# Patient Record
Sex: Male | Born: 1985 | Race: Black or African American | Hispanic: No | Marital: Single | Smoking: Current some day smoker
Health system: Southern US, Community
[De-identification: ages and names within clinical notes are randomized; demographics above are authoritative.]

## PROBLEM LIST (undated history)

## (undated) DIAGNOSIS — I10 Essential (primary) hypertension: Secondary | ICD-10-CM

## (undated) HISTORY — DX: Essential (primary) hypertension: I10

---

## 2006-09-09 ENCOUNTER — Emergency Department (HOSPITAL_COMMUNITY): Admission: EM | Admit: 2006-09-09 | Discharge: 2006-09-09 | Payer: Self-pay | Admitting: Emergency Medicine

## 2006-09-22 ENCOUNTER — Emergency Department (HOSPITAL_COMMUNITY): Admission: EM | Admit: 2006-09-22 | Discharge: 2006-09-22 | Payer: Self-pay | Admitting: Emergency Medicine

## 2006-12-16 ENCOUNTER — Emergency Department (HOSPITAL_COMMUNITY): Admission: EM | Admit: 2006-12-16 | Discharge: 2006-12-16 | Payer: Self-pay | Admitting: Emergency Medicine

## 2013-03-09 ENCOUNTER — Emergency Department (HOSPITAL_COMMUNITY)
Admission: EM | Admit: 2013-03-09 | Discharge: 2013-03-09 | Disposition: A | Discharge status: Home / Self Care | Payer: Self-pay | Attending: Emergency Medicine | Admitting: Emergency Medicine

## 2013-03-09 ENCOUNTER — Emergency Department (HOSPITAL_COMMUNITY): Payer: Self-pay

## 2013-03-09 ENCOUNTER — Encounter (HOSPITAL_COMMUNITY): Payer: Self-pay | Admitting: Emergency Medicine

## 2013-03-09 DIAGNOSIS — M25569 Pain in unspecified knee: Secondary | ICD-10-CM | POA: Insufficient documentation

## 2013-03-09 DIAGNOSIS — M25562 Pain in left knee: Secondary | ICD-10-CM

## 2013-03-09 MED ORDER — IBUPROFEN 600 MG PO TABS: 600.0000 mg | ORAL_TABLET | Freq: Four times a day (QID) | ORAL | Status: AC | PRN

## 2013-03-09 NOTE — ED Notes (Signed)
Pt c/o lt knee pain for months, no injury just wanted to have it checked out

## 2013-03-09 NOTE — ED Provider Notes (Signed)
CSN: [id number]     Arrival date & time 03/09/13  1608 History   First MD Initiated Contact with Patient 03/09/13 1653     This chart was scribed for Henry Goodman, by Ladona Ridgel Day, ED scribe. This patient was seen in room WTR8/WTR8 and the patient's care was started at 1653.  Chief Complaint  Patient presents with  . Knee Pain   The history is provided by the patient. No language interpreter was used.   HPI Comments: Henry Goodman is a 27 y.o. male who presents to the Emergency Department complaining of constant, gradually worsened left knee pain (10/10 max in pain and currently 2/10), onset 2 months ago, no recent injuries. He states pain w/ambulation or extending his knee. He has been taking aleve w/out any improvement in his pain. Denies previous knee surgeries.   History reviewed. No pertinent past medical history. History reviewed. No pertinent past surgical history. No family history on file. History  Substance Use Topics  . Smoking status: Never Smoker   . Smokeless tobacco: Not on file  . Alcohol Use: Yes     Comment: social    Review of Systems  Musculoskeletal:       Left knee pain.  All other systems reviewed and are negative.   A complete 10 system review of systems was obtained and all systems are negative except as noted in the HPI and PMH.   Allergies  Review of patient's allergies indicates no known allergies.  Home Medications   Current Outpatient Rx  Name  Route  Sig  Dispense  Refill  . Multiple Vitamin (MULTIVITAMIN WITH MINERALS) TABS tablet   Oral   Take 1 tablet by mouth daily.         Marland Kitchen ibuprofen (ADVIL,MOTRIN) 600 MG tablet   Oral   Take 1 tablet (600 mg total) by mouth every 6 (six) hours as needed.   30 tablet   0    Triage Vitals: BP 147/82  Pulse 62  Temp(Src) 98.6 F (37 C) (Oral)  Resp 20  SpO2 97% Physical Exam  Nursing note and vitals reviewed. Constitutional: He is oriented to person, place, and time. He appears  well-developed and well-nourished.  HENT:  Head: Normocephalic and atraumatic.  Eyes: EOM are normal.  Neck: Normal range of motion.  Cardiovascular: Normal rate.   Pulmonary/Chest: Effort normal.  Musculoskeletal: Normal range of motion. He exhibits tenderness. He exhibits no edema.  Tender over anterior proximal tibial tuberosity. Full ROM no medial or lateral joint line tenderness. No edema, ecchymosis, erythema or warmth  Neurological: He is alert and oriented to person, place, and time.  Skin: Skin is warm and dry.  Psychiatric: He has a normal mood and affect. His behavior is normal.    ED Course  Procedures (including critical care time) DIAGNOSTIC STUDIES: Oxygen Saturation is 97% on room air, normal by my interpretation.    COORDINATION OF CARE: At 500 PM Discussed treatment plan with patient which includes left knee X-ray. Patient agrees.   Labs Review Labs Reviewed - No data to display Imaging Review Dg Knee Complete 4 Views Left  03/09/2013   CLINICAL DATA:  Left knee pain  EXAM: LEFT KNEE - COMPLETE 4+ VIEW  COMPARISON:  None.  FINDINGS: There is no evidence of fracture, dislocation, or joint effusion. There is no evidence of arthropathy or other focal bone abnormality. Soft tissues are unremarkable.  IMPRESSION: No acute abnormality noted.   Electronically Signed   By: Loraine Leriche  Lukens M.D.   On: 03/09/2013 17:22    EKG Interpretation   None       MDM   1. Left knee pain    Pt c/o 88mo hx of left knee pain w/o known injury. No evidence of cellulitis or trauma. Plain films: no acute abnormality. No evidence of joint effusion or arthropathy. Advised will tx with knee sleeve and antiinflammatories. Will have f/u with [facility] in 1-2 weeks if not improving. Pt verbalized understanding and agreement with tx plan.  I personally performed the services described in this documentation, which was scribed in my presence. The recorded information has been reviewed  and is accurate.     Junius Finner, PA-C 03/09/13 1759

## 2013-03-10 NOTE — ED Provider Notes (Signed)
  Medical screening examination/treatment/procedure(s) were performed by non-physician practitioner and as supervising physician I was immediately available for consultation/collaboration.  EKG Interpretation   None          Janeal Abadi, MD 03/10/13 0015 

## 2014-06-18 IMAGING — CR DG KNEE COMPLETE 4+V*L*
5 series · 5 of 5 positions shown · non-contrast
Comparison: None.

CLINICAL DATA: Left knee pain

EXAM:
LEFT KNEE - COMPLETE 4+ VIEW

[t knee ap left]
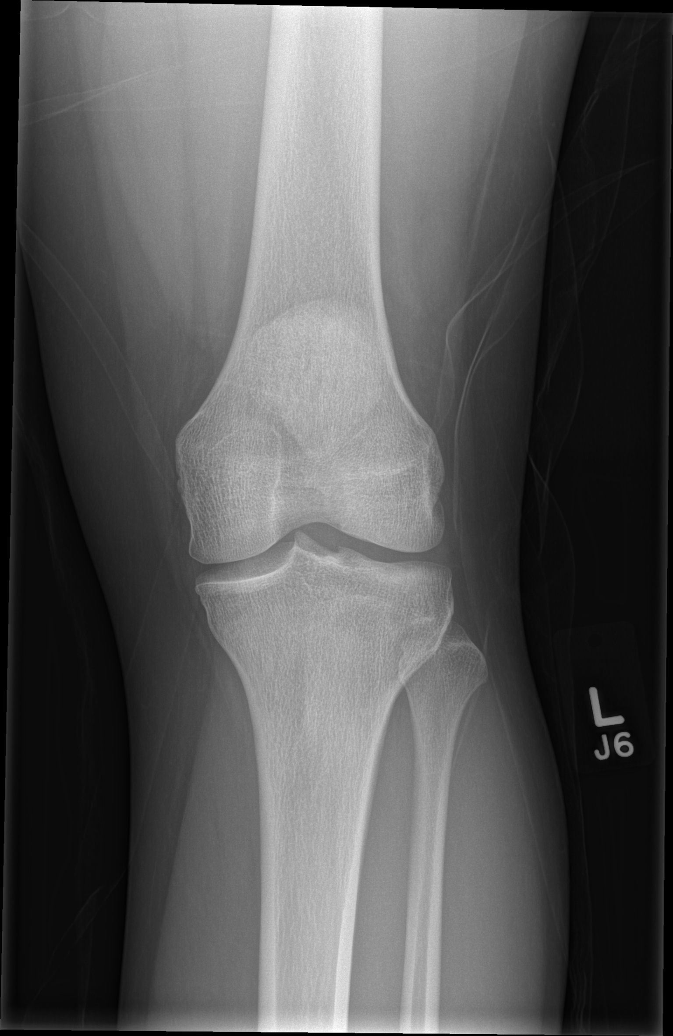

[t knee obl left (1 of 2)]
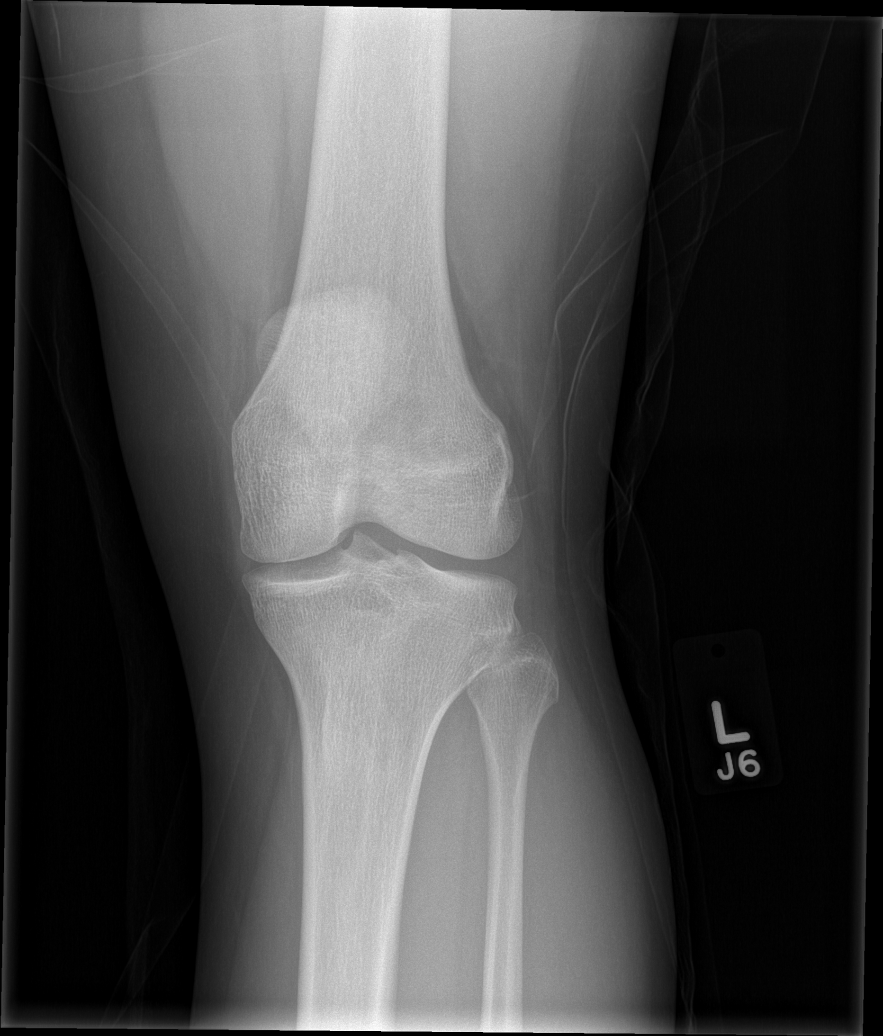

[t knee obl left (2 of 2)]
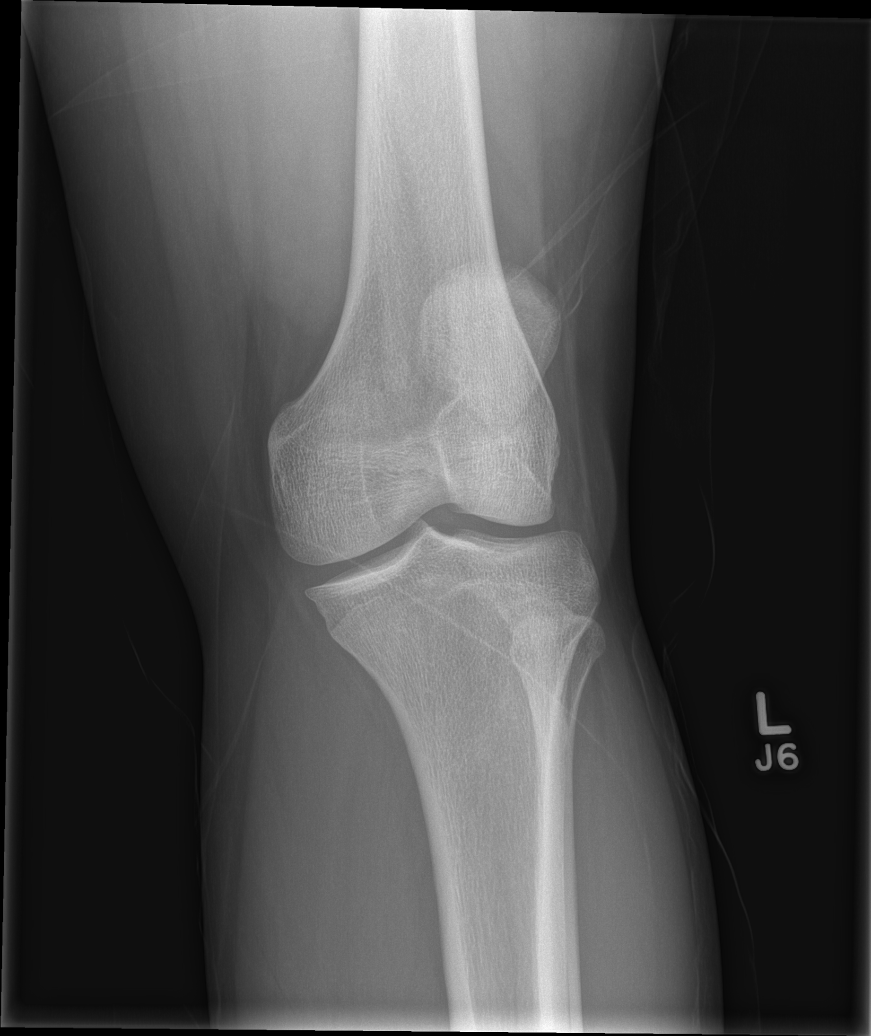

[t knee lat left (1 of 2)]
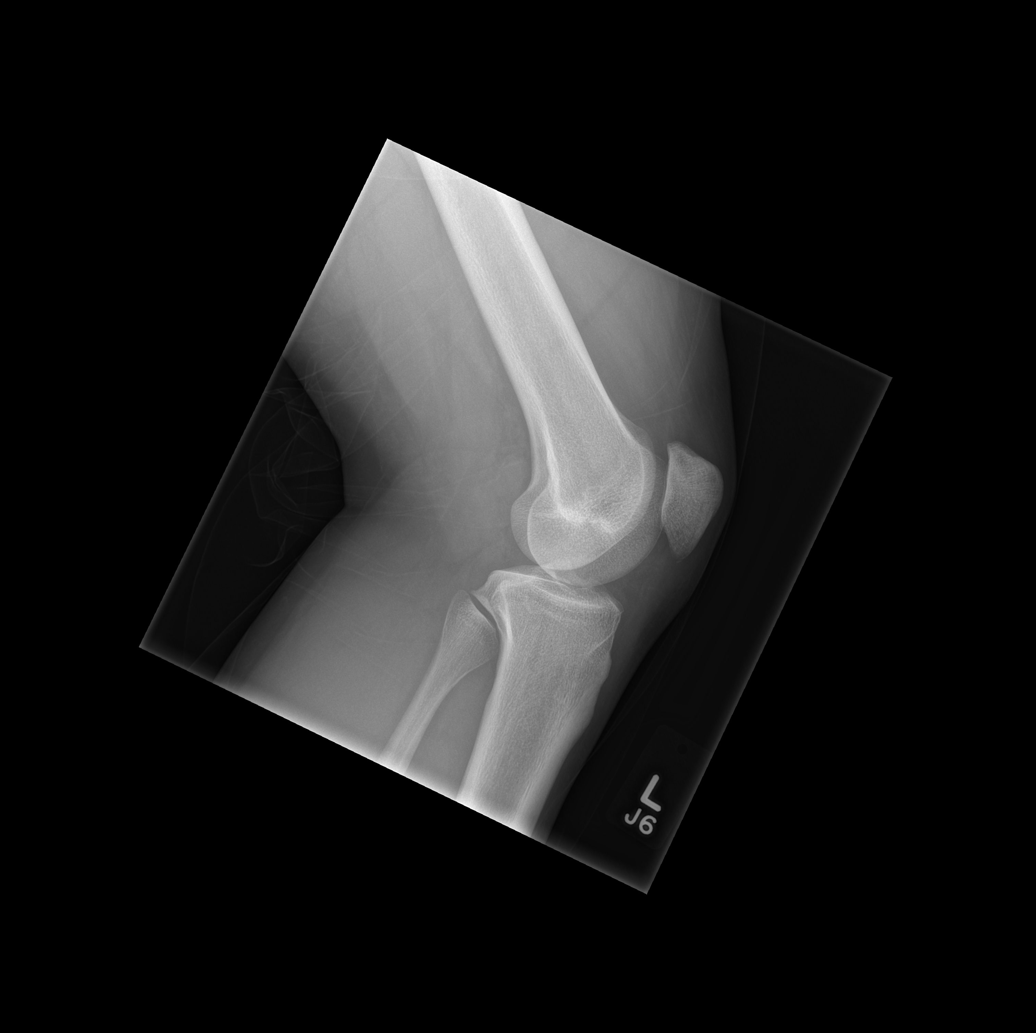

[t knee lat left (2 of 2)]
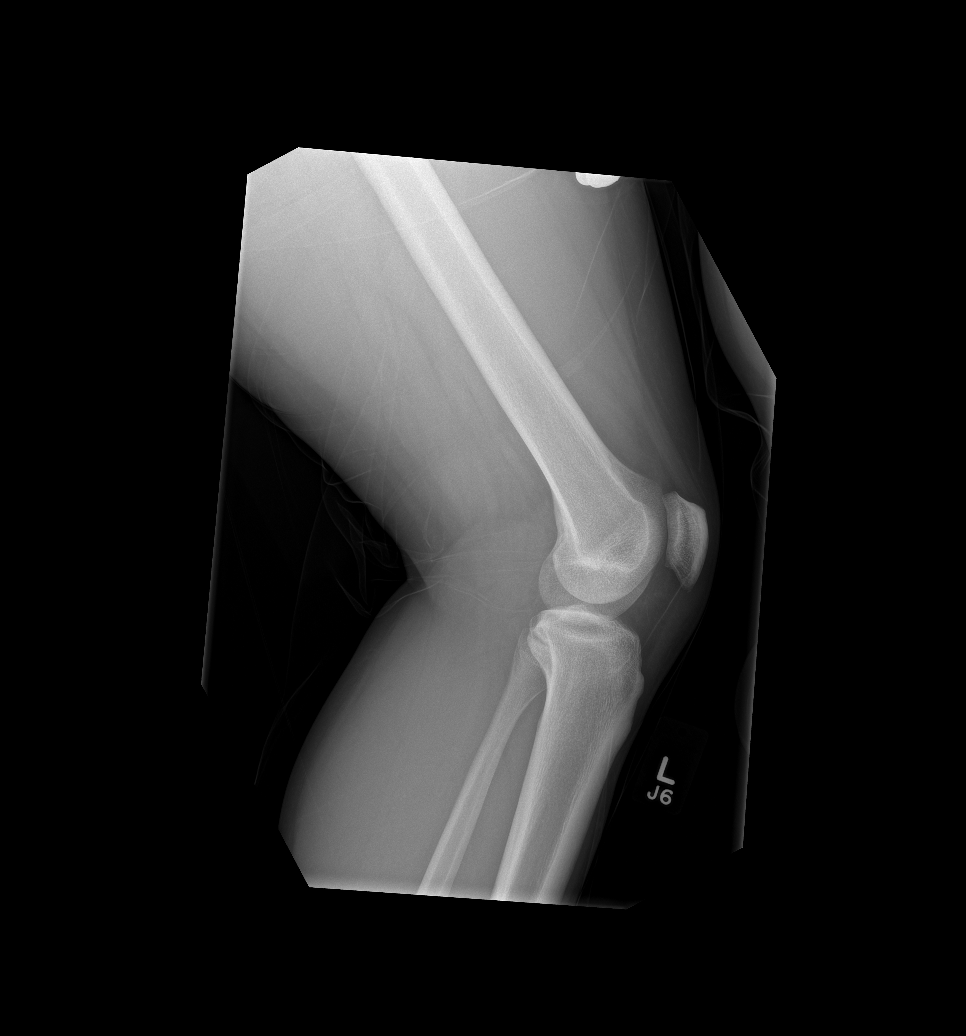

[5 of 5 positions shown; findings below may reference images not displayed]

FINDINGS: There is no evidence of fracture, dislocation, or joint effusion.
There is no evidence of arthropathy or other focal bone abnormality.
Soft tissues are unremarkable.
IMPRESSION: No acute abnormality noted.

## 2021-11-16 ENCOUNTER — Other Ambulatory Visit: Payer: Self-pay

## 2021-11-16 ENCOUNTER — Emergency Department (HOSPITAL_COMMUNITY)
Admission: EM | Admit: 2021-11-16 | Discharge: 2021-11-16 | Disposition: A | Discharge status: Home / Self Care | Payer: Self-pay | Attending: Emergency Medicine | Admitting: Emergency Medicine

## 2021-11-16 DIAGNOSIS — I10 Essential (primary) hypertension: Secondary | ICD-10-CM | POA: Insufficient documentation

## 2021-11-16 DIAGNOSIS — R112 Nausea with vomiting, unspecified: Secondary | ICD-10-CM | POA: Insufficient documentation

## 2021-11-16 LAB — COMPREHENSIVE METABOLIC PANEL
ALT: 40 U/L (ref 0–44)
AST: 28 U/L (ref 15–41)
Albumin: 3.8 g/dL (ref 3.5–5.0)
Alkaline Phosphatase: 79 U/L (ref 38–126)
Anion gap: 13 (ref 5–15)
BUN: 14 mg/dL (ref 6–20)
CO2: 22 mmol/L (ref 22–32)
Calcium: 8.8 mg/dL — ABNORMAL LOW (ref 8.9–10.3)
Chloride: 104 mmol/L (ref 98–111)
Creatinine, Ser: 0.93 mg/dL (ref 0.61–1.24)
GFR, Estimated: >60 mL/min (ref >60)
Glucose, Bld: 110 mg/dL — ABNORMAL HIGH (ref 70–99)
Potassium: 3.7 mmol/L (ref 3.5–5.1)
Sodium: 139 mmol/L (ref 135–145)
Total Bilirubin: 0.9 mg/dL (ref 0.3–1.2)
Total Protein: 6.8 g/dL (ref 6.5–8.1)

## 2021-11-16 LAB — CBC
HCT: 45.1 % (ref 39.0–52.0)
Hemoglobin: 15.1 g/dL (ref 13.0–17.0)
MCH: 28.9 pg (ref 26.0–34.0)
MCHC: 33.5 g/dL (ref 30.0–36.0)
MCV: 86.4 fL (ref 80.0–100.0)
Platelets: 359 10*3/uL (ref 150–400)
RBC: 5.22 MIL/uL (ref 4.22–5.81)
RDW: 14 % (ref 11.5–15.5)
WBC: 8.7 10*3/uL (ref 4.0–10.5)
nRBC: 0 % (ref 0.0–0.2)

## 2021-11-16 LAB — URINALYSIS, ROUTINE W REFLEX MICROSCOPIC
Bilirubin Urine: NEGATIVE
Glucose, UA: NEGATIVE mg/dL
Hgb urine dipstick: NEGATIVE
Ketones, ur: NEGATIVE mg/dL
Nitrite: NEGATIVE
Protein, ur: NEGATIVE mg/dL
Specific Gravity, Urine: 1.024 (ref 1.005–1.030)
WBC, UA: >50 WBC/hpf — ABNORMAL HIGH (ref 0–5)
pH: 5 (ref 5.0–8.0)

## 2021-11-16 LAB — LIPASE, BLOOD: Lipase: 27 U/L (ref 11–51)

## 2021-11-16 MED ORDER — ONDANSETRON 4 MG PO TBDP: 4.0000 mg | ORAL_TABLET | Freq: Once | ORAL | Status: DC

## 2021-11-16 NOTE — ED Provider Notes (Signed)
[facility] EMERGENCY DEPARTMENT Provider Note   CSN: [id number] Arrival date & time: 11/16/21  1138     History  Chief Complaint  Patient presents with   Emesis    Henry Goodman is a 36 y.o. male.  36 year old male presents with complaint of nausea and vomiting with frequent stools last night.  Symptoms have since resolved, he was concerned that he may have gotten food poisoning however states he did the cooking last night and doubt it was anything it was in his food.  He denies any fevers or chills, changes in bladder habits.  Blood pressure noted to be elevated today.  Patient does aware and has been trying to control diet and exercise.       Home Medications Prior to Admission medications   Medication Sig Start Date End Date Taking? Authorizing Provider  ibuprofen (ADVIL,MOTRIN) 600 MG tablet Take 1 tablet (600 mg total) by mouth every 6 (six) hours as needed. 03/09/13   Lurene Shadow, PA-C  Multiple Vitamin (MULTIVITAMIN WITH MINERALS) TABS tablet Take 1 tablet by mouth daily.    [provider]      Allergies    Patient has no known allergies.    Review of Systems   Review of Systems Negative except as per HPI Physical Exam Updated Vital Signs BP (!) 162/116   Pulse (!) 106   Temp 98.6 F (37 C) (Oral)   Resp 18   SpO2 97%  Physical Exam Vitals and nursing note reviewed.  Constitutional:      General: He is not in acute distress.    Appearance: He is well-developed. He is not diaphoretic.  HENT:     Head: Normocephalic and atraumatic.  Cardiovascular:     Rate and Rhythm: Normal rate and regular rhythm.     Heart sounds: Normal heart sounds.  Pulmonary:     Effort: Pulmonary effort is normal.     Breath sounds: Normal breath sounds.  Abdominal:     Palpations: Abdomen is soft.     Tenderness: There is no abdominal tenderness. There is no right CVA tenderness or left CVA tenderness.  Musculoskeletal:     Right lower leg:  No edema.     Left lower leg: No edema.  Skin:    General: Skin is warm and dry.     Findings: No erythema or rash.  Neurological:     Mental Status: He is alert and oriented to person, place, and time.  Psychiatric:        Behavior: Behavior normal.     ED Results / Procedures / Treatments   Labs (all labs ordered are listed, but only abnormal results are displayed) Labs Reviewed  COMPREHENSIVE METABOLIC PANEL - Abnormal; Notable for the following components:      Result Value   Glucose, Bld 110 (*)    Calcium 8.8 (*)    All other components within normal limits  URINALYSIS, ROUTINE W REFLEX MICROSCOPIC - Abnormal; Notable for the following components:   APPearance HAZY (*)    Leukocytes,Ua MODERATE (*)    WBC, UA >50 (*)    Bacteria, UA RARE (*)    All other components within normal limits  LIPASE, BLOOD  CBC  GC/CHLAMYDIA PROBE AMP ([place]) NOT AT [facility]    EKG None  Radiology No results found.  Procedures Procedures    Medications Ordered in ED Medications  ondansetron (ZOFRAN-ODT) disintegrating tablet 4 mg (has no administration in time  range)    ED Course/ Medical Decision Making/ A&P                           Medical Decision Making  This patient presents to the ED for concern of vomiting, this involves an extensive number of treatment options, and is a complaint that carries with it a high risk of complications and morbidity.  The differential diagnosis includes but not limited to gastritis, gastroenteritis   Co morbidities that complicate the patient evaluation  Unmanaged hypertension   Additional history obtained:  External records from outside source obtained and reviewed including no recent relevant records   Lab Tests:  I Ordered, and personally interpreted labs.  The pertinent results include: CBC is unremarkable, CMP within normal limits.  Lipase is normal.  Urinalysis is hazy with moderate leukocytes with greater than 50 white  cells and rare bacteria.  No urinary symptoms today.  Add on gonorrhea chlamydia sent out.   Problem List / ED Course / Critical interventions / Medication management  36 year old male with complaint of vomiting and frequent stools last night.  Presents today feeling improved with no further vomiting, is tolerating small sips of water.  Abdomen is soft and nontender.  Patient requests note as he has missed his jail time this weekend. I ordered medication including Zofran for nausea Reevaluation of the patient after these medicines showed that the patient resolved I have reviewed the patients home medicines and have made adjustments as needed   Social Determinants of Health:  No PCP   Test / Admission - Considered:  Consider abdominal imaging however symptoms have resolved.  Add on gonorrhea chlamydia for bacteria.         Final Clinical Impression(s) / ED Diagnoses Final diagnoses:  Nausea and vomiting, unspecified vomiting type  Hypertension, unspecified type    Rx / DC Orders ED Discharge Orders     None         Jeannie Fend, PA-C 11/16/21 1526    Cathren Laine, MD 11/16/21 [id number]

## 2021-11-16 NOTE — Discharge Instructions (Addendum)
Recheck with your primary care provider as needed. Monitor your blood pressure.  This should be managed by your primary care provider to prevent developing secondary complications from unmanaged high blood pressure.

## 2021-11-16 NOTE — ED Triage Notes (Signed)
Pt comes to the ED from home for eval of n/v since last night. No abdominal pain.  Pt has had frequent bowel movements but not watery diarrhea.  Pt feels this is similar to "food poisoning" he has had in the past. No fever, chills or shob.  Pt is hypertensive and states he is aware and has been trying to control with diet and exercise.

## 2021-12-14 ENCOUNTER — Other Ambulatory Visit: Payer: Self-pay

## 2021-12-14 ENCOUNTER — Emergency Department (HOSPITAL_BASED_OUTPATIENT_CLINIC_OR_DEPARTMENT_OTHER)
Admission: EM | Admit: 2021-12-14 | Discharge: 2021-12-14 | Disposition: A | Discharge status: Home / Self Care | Payer: Self-pay | Attending: Emergency Medicine | Admitting: Emergency Medicine

## 2021-12-14 ENCOUNTER — Encounter (HOSPITAL_BASED_OUTPATIENT_CLINIC_OR_DEPARTMENT_OTHER): Payer: Self-pay | Admitting: Emergency Medicine

## 2021-12-14 DIAGNOSIS — J069 Acute upper respiratory infection, unspecified: Secondary | ICD-10-CM | POA: Insufficient documentation

## 2021-12-14 MED ORDER — CETIRIZINE HCL 10 MG PO TABS: 10.0000 mg | ORAL_TABLET | Freq: Every day | ORAL | 0 refills | Status: AC

## 2021-12-14 MED ORDER — ONDANSETRON 4 MG PO TBDP: 4.0000 mg | ORAL_TABLET | Freq: Three times a day (TID) | ORAL | 0 refills | Status: AC | PRN

## 2021-12-14 NOTE — ED Notes (Signed)
Patient reports a dry cough. Denies any sob. Report nausea and vomiting . Vomit x4 in last 24 hours Some dizziness earlier denies dizziness now

## 2021-12-14 NOTE — Discharge Instructions (Signed)
Viral Illness TREATMENT  Treatment is directed at relieving symptoms. There is no cure. Antibiotics are not effective, because the infection is caused by a virus, not by bacteria. Treatment may include:  Increased fluid intake. Sports drinks offer valuable electrolytes, sugars, and fluids.  Breathing heated mist or steam (vaporizer or shower).  Eating chicken soup or other clear broths, and maintaining good nutrition.  Getting plenty of rest.  Using gargles or lozenges for comfort.  Increasing usage of your inhaler if you have asthma.  Return to work when your temperature has returned to normal.  Gargle warm salt water and spit it out for sore throat. Take benadryl to decrease sinus secretions. Continue to alternate between Tylenol and ibuprofen for pain and fever control.  Follow Up: Follow up with your primary care doctor in 5-7 days for recheck of ongoing symptoms.  Return to emergency department for emergent changing or worsening of symptoms.  

## 2021-12-14 NOTE — ED Provider Notes (Signed)
[facility] EMERGENCY DEPARTMENT Provider Note   CSN: [id number] Arrival date & time: 12/14/21  1244     History  Chief Complaint  Patient presents with   Cough   Emesis    Henry Goodman is a 36 y.o. male.   Cough Emesis Associated symptoms: cough    Patient is a 36 year old male presented today to the emergency room states that he has felt somewhat achy and tired and had a cough which is primarily dry but occasionally productive of small amount of clear sputum.  He states that he has been eating and drinking normally.  He states that he did have 2 episodes of nonbloody nonbilious emesis.  He has not taken any Tylenol or ibuprofen but did take some Tamiflu yesterday.  He is not particularly interested in being tested for COVID-19 or influenza today.  He states that he came primarily for a work note.      Home Medications Prior to Admission medications   Medication Sig Start Date End Date Taking? Authorizing Provider  cetirizine (ZYRTEC ALLERGY) 10 MG tablet Take 1 tablet (10 mg total) by mouth daily. 12/14/21  Yes Kaela Beitz S, PA  ondansetron (ZOFRAN-ODT) 4 MG disintegrating tablet Take 1 tablet (4 mg total) by mouth every 8 (eight) hours as needed for nausea or vomiting. 12/14/21  Yes Kingsley Herandez S, PA  ibuprofen (ADVIL,MOTRIN) 600 MG tablet Take 1 tablet (600 mg total) by mouth every 6 (six) hours as needed. 03/09/13   Lurene Shadow, PA-C  Multiple Vitamin (MULTIVITAMIN WITH MINERALS) TABS tablet Take 1 tablet by mouth daily.    [provider]      Allergies    Patient has no known allergies.    Review of Systems   Review of Systems  Respiratory:  Positive for cough.   Gastrointestinal:  Positive for vomiting.    Physical Exam Updated Vital Signs BP (!) 157/105   Pulse 80   Temp 98.3 F (36.8 C) (Oral)   Resp 16   Ht 5\' 8"  (1.727 m)   Wt 111.1 kg   SpO2 100%   BMI 37.25 kg/m  Physical Exam Vitals and nursing note reviewed.   Constitutional:      General: He is not in acute distress.    Comments: Pleasant well-appearing 36 year old.  In no acute distress.  Sitting comfortably in bed.  Able answer questions appropriately follow commands. No increased work of breathing. Speaking in full sentences.   HENT:     Head: Normocephalic and atraumatic.     Nose: Nose normal.  Eyes:     General: No scleral icterus. Cardiovascular:     Rate and Rhythm: Normal rate and regular rhythm.     Pulses: Normal pulses.     Heart sounds: Normal heart sounds.  Pulmonary:     Effort: Pulmonary effort is normal. No respiratory distress.     Breath sounds: No wheezing.     Comments: Lungs clear, speaking full sentences Abdominal:     Palpations: Abdomen is soft.     Tenderness: There is no abdominal tenderness.  Musculoskeletal:     Cervical back: Normal range of motion.     Right lower leg: No edema.     Left lower leg: No edema.  Skin:    General: Skin is warm and dry.     Capillary Refill: Capillary refill takes less than 2 seconds.  Neurological:     Mental Status: He is alert. Mental status is  at baseline.  Psychiatric:        Mood and Affect: Mood normal.        Behavior: Behavior normal.     ED Results / Procedures / Treatments   Labs (all labs ordered are listed, but only abnormal results are displayed) Labs Reviewed - No data to display  EKG None  Radiology No results found.  Procedures Procedures    Medications Ordered in ED Medications - No data to display  ED Course/ Medical Decision Making/ A&P Clinical Course as of 12/14/21 1612  Sat Dec 14, 2021  1603 Pt without medical issues.  States he coughed today a few times felt achy and tired. States no tylenol or ibuprofen. Vomited x2 times earlier. No blood in vomit.   [WF]    Clinical Course User Index [WF] Gailen Shelter, [place]                           Medical Decision Making Risk OTC drugs. Prescription drug management.   Patient is  a 36 year old male presented today to the emergency room states that he has felt somewhat achy and tired and had a cough which is primarily dry but occasionally productive of small amount of clear sputum.  He states that he has been eating and drinking normally.  He states that he did have 2 episodes of nonbloody nonbilious emesis.  He has not taken any Tylenol or ibuprofen but did take some Tamiflu yesterday.  He is not particularly interested in being tested for COVID-19 or influenza today.  He states that he came primarily for a work note.  Physical exam unremarkable.  Patient has clear lung sounds.  Normal vital signs.  Overall well-appearing.  Discussed additional work-up but will hold off at this time after shared decision-making conversation patient states he primarily wants work note.  Satting 100% on room air.  Breathing well and unlabored.  No chest pain or difficulty breathing.  No abdominal pain and does not feel nauseous currently.  We will discharge home with Zyrtec and Zofran in case he experiences any additional nausea.  Work note printed for patient as well.  Return precautions discussed  Final Clinical Impression(s) / ED Diagnoses Final diagnoses:  Viral upper respiratory tract infection    Rx / DC Orders ED Discharge Orders          Ordered    cetirizine (ZYRTEC ALLERGY) 10 MG tablet  Daily        12/14/21 1610    ondansetron (ZOFRAN-ODT) 4 MG disintegrating tablet  Every 8 hours PRN        12/14/21 1610              Gailen Shelter, [place] 12/14/21 1615    Virgina Norfolk, DO 12/14/21 2230

## 2021-12-14 NOTE — ED Triage Notes (Signed)
Pt arrives pov, steady gait, c/o cough and emesis since last night. Denies fever, denies abdominal pain, denies CP. Reports need of dr note

## 2024-03-08 ENCOUNTER — Other Ambulatory Visit: Payer: Self-pay

## 2024-03-08 ENCOUNTER — Emergency Department (HOSPITAL_COMMUNITY)

## 2024-03-08 ENCOUNTER — Emergency Department (HOSPITAL_COMMUNITY)
Admission: EM | Admit: 2024-03-08 | Discharge: 2024-03-08 | Disposition: A | Discharge status: Home / Self Care | Attending: Emergency Medicine | Admitting: Emergency Medicine

## 2024-03-08 DIAGNOSIS — I159 Secondary hypertension, unspecified: Secondary | ICD-10-CM | POA: Insufficient documentation

## 2024-03-08 DIAGNOSIS — M5441 Lumbago with sciatica, right side: Secondary | ICD-10-CM | POA: Insufficient documentation

## 2024-03-08 LAB — URINALYSIS, ROUTINE W REFLEX MICROSCOPIC
Bilirubin Urine: NEGATIVE
Glucose, UA: NEGATIVE mg/dL
Hgb urine dipstick: NEGATIVE
Ketones, ur: NEGATIVE mg/dL
Leukocytes,Ua: NEGATIVE
Nitrite: NEGATIVE
Protein, ur: NEGATIVE mg/dL
Specific Gravity, Urine: 1.015 (ref 1.005–1.030)
pH: 5 (ref 5.0–8.0)

## 2024-03-08 LAB — CBG MONITORING, ED: Glucose-Capillary: 100 mg/dL — ABNORMAL HIGH (ref 70–99)

## 2024-03-08 MED ORDER — NAPROXEN 500 MG PO TABS: 500.0000 mg | ORAL_TABLET | Freq: Two times a day (BID) | ORAL | 0 refills | Status: AC

## 2024-03-08 MED ORDER — KETOROLAC TROMETHAMINE 15 MG/ML IJ SOLN
15.0000 mg | Freq: Once | INTRAMUSCULAR | Status: AC
  Administered 2024-03-08: 15 mg via INTRAMUSCULAR
  Filled 2024-03-08: qty 1

## 2024-03-08 MED ORDER — PREDNISONE 20 MG PO TABS: 40.0000 mg | ORAL_TABLET | Freq: Every day | ORAL | 0 refills | Status: AC

## 2024-03-08 MED ORDER — NAPROXEN 500 MG PO TABS: 500.0000 mg | ORAL_TABLET | Freq: Two times a day (BID) | ORAL | 0 refills | Status: DC

## 2024-03-08 MED ORDER — CYCLOBENZAPRINE HCL 10 MG PO TABS: 10.0000 mg | ORAL_TABLET | Freq: Three times a day (TID) | ORAL | 0 refills | Status: DC

## 2024-03-08 MED ORDER — OXYCODONE HCL 5 MG PO TABS
5.0000 mg | ORAL_TABLET | Freq: Once | ORAL | Status: AC
  Administered 2024-03-08: 5 mg via ORAL
  Filled 2024-03-08: qty 1

## 2024-03-08 MED ORDER — CYCLOBENZAPRINE HCL 10 MG PO TABS: 10.0000 mg | ORAL_TABLET | Freq: Three times a day (TID) | ORAL | 0 refills | Status: AC

## 2024-03-08 MED ORDER — DIAZEPAM 5 MG PO TABS
5.0000 mg | ORAL_TABLET | Freq: Once | ORAL | Status: AC
  Administered 2024-03-08: 5 mg via ORAL
  Filled 2024-03-08: qty 1

## 2024-03-08 MED ORDER — LIDOCAINE 5 % EX PTCH
1.0000 | MEDICATED_PATCH | CUTANEOUS | Status: DC
  Administered 2024-03-08: 1 via TRANSDERMAL
  Filled 2024-03-08: qty 1

## 2024-03-08 MED ORDER — PREDNISONE 20 MG PO TABS: 40.0000 mg | ORAL_TABLET | Freq: Every day | ORAL | 0 refills | Status: DC

## 2024-03-08 NOTE — ED Triage Notes (Addendum)
 PT ambulatory to triage with complaints of RIGHT leg pain that has been ongoing for approx 1 week. PT states that he attempted to rest for several days, but that did not help. Pt is concerned he might have sciatica.

## 2024-03-08 NOTE — ED Provider Notes (Signed)
  EMERGENCY DEPARTMENT AT [facility] Provider Note   CSN: [id number] Arrival date & time: 03/08/24  9268     Patient presents with: Leg Pain   Henry Goodman is a 38 y.o. male.   38 year old male with no past medical history presents to the ED with a chief complaint of low back pain and right leg pain that is been ongoing for approximately a week and a half.  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  He reports the pain has been exacerbated by sitting down, weightbearing, ambulating on his leg.  He has tried over-the-counter medication such as heat, ice, ibuprofen , Tylenol without any improvement in symptoms.  He does not have any prior history of IV drug use, no bowel or bladder complaints, no fever, no other complaints reported.  The history is provided by the patient.  Leg Pain Location:  Leg Time since incident:  2 weeks Injury: no   Leg location:  R leg Pain details:    Quality:  Aching and throbbing   Radiates to:  Does not radiate   Severity:  Moderate   Onset quality:  Gradual   Duration:  2 weeks   Timing:  Constant   Progression:  Worsening Chronicity:  New Dislocation: no   Foreign body present:  No foreign bodies Prior injury to area:  No Relieved by:  Nothing Worsened by:  Bearing weight and activity Ineffective treatments:  Acetaminophen, ice and heat Associated symptoms: back pain   Associated symptoms: no fever, no itching and no muscle weakness   Risk factors: obesity   Risk factors: no concern for non-accidental trauma and no frequent fractures        Prior to Admission medications   Medication Sig Start Date End Date Taking? Authorizing Provider  cyclobenzaprine (FLEXERIL) 10 MG tablet Take 1 tablet (10 mg total) by mouth 3 (three) times daily for 7 days. 03/08/24 03/15/24 Yes Russie Gulledge, PA-C  naproxen (NAPROSYN) 500 MG tablet Take 1 tablet (500 mg total) by mouth 2 (two) times  daily for 7 days. 03/08/24 03/15/24 Yes Caydence Koenig, PA-C  predniSONE (DELTASONE) 20 MG tablet Take 2 tablets (40 mg total) by mouth daily for 5 days. 03/08/24 03/13/24 Yes Kseniya Grunden, PA-C  cetirizine  (ZYRTEC  ALLERGY) 10 MG tablet Take 1 tablet (10 mg total) by mouth daily. 12/14/21   Neldon Hamp RAMAN, PA  ibuprofen  (ADVIL ,MOTRIN ) 600 MG tablet Take 1 tablet (600 mg total) by mouth every 6 (six) hours as needed. 03/09/13   Anitra Rocky KIDD, PA-C  Multiple Vitamin (MULTIVITAMIN WITH MINERALS) TABS tablet Take 1 tablet by mouth daily.    [provider]  ondansetron  (ZOFRAN -ODT) 4 MG disintegrating tablet Take 1 tablet (4 mg total) by mouth every 8 (eight) hours as needed for nausea or vomiting. 12/14/21   Neldon Hamp RAMAN, PA    Allergies: Patient has no known allergies.    Review of Systems  Constitutional:  Negative for fever.  Respiratory:  Negative for shortness of breath.   Cardiovascular:  Negative for chest pain and leg swelling.  Gastrointestinal:  Negative for abdominal pain.  Musculoskeletal:  Positive for back pain.  Skin:  Negative for itching.  All other systems reviewed and are negative.   Updated Vital Signs BP (!) 172/120   Pulse 78   Temp 98.6 F (37 C) (Oral)   Resp 20   Ht 5' 7 (1.702 m)  Wt 117.9 kg   SpO2 99%   BMI 40.72 kg/m   Physical Exam Vitals and nursing note reviewed.  Constitutional:      Appearance: Normal appearance.  HENT:     Head: Normocephalic and atraumatic.     Mouth/Throat:     Mouth: Mucous membranes are moist.  Eyes:     Pupils: Pupils are equal, round, and reactive to light.  Cardiovascular:     Rate and Rhythm: Normal rate.  Pulmonary:     Effort: Pulmonary effort is normal.  Abdominal:     General: Abdomen is flat.  Musculoskeletal:     Cervical back: Normal range of motion and neck supple.     Lumbar back: Tenderness present.     Comments: RLE- KF,KE 5/5 strength LLE- HF, HE 5/5 strength Normal gait. No  pronator drift. No leg drop.  CN I, II and VIII not tested. CN II-XII grossly intact bilaterally.      Skin:    General: Skin is warm and dry.  Neurological:     Mental Status: He is alert and oriented to person, place, and time.     (all labs ordered are listed, but only abnormal results are displayed) Labs Reviewed  URINALYSIS, ROUTINE W REFLEX MICROSCOPIC - Abnormal; Notable for the following components:      Result Value   Color, Urine STRAW (*)    Bacteria, UA RARE (*)    All other components within normal limits  CBG MONITORING, ED - Abnormal; Notable for the following components:   Glucose-Capillary 100 (*)    All other components within normal limits    EKG: None  Radiology: DG Lumbar Spine Complete Result Date: 03/08/2024 EXAM: 4 VIEW(S) XRAY OF THE LUMBAR SPINE 03/08/2024 08:33:00 AM COMPARISON: None available. CLINICAL HISTORY: low back pain FINDINGS: LUMBAR SPINE: BONES: 5 non-rib-bearing lumbar-type vertebral bodies which are normally aligned. No spondylolisthesis. No pars intraarticularis defects. No acute fracture. DISCS AND DEGENERATIVE CHANGES: Interval disc heights are well maintained. SOFT TISSUES: No acute abnormality. IMPRESSION: 1. No acute fracture or malalignment of the lumbar spine. Electronically signed by: Rogelia Myers MD 03/08/2024 08:59 AM EST RP Workstation: [id number]     Procedures   Medications Ordered in the ED  lidocaine (LIDODERM) 5 % 1 patch (1 patch Transdermal Patch Applied 03/08/24 0911)  diazepam (VALIUM) tablet 5 mg (5 mg Oral Given 03/08/24 0910)  ketorolac (TORADOL) 15 MG/ML injection 15 mg (15 mg Intramuscular Given 03/08/24 0910)  oxyCODONE (Oxy IR/ROXICODONE) immediate release tablet 5 mg (5 mg Oral Given 03/08/24 0910)                                    Medical Decision Making Amount and/or Complexity of Data Reviewed Labs: ordered. Radiology: ordered.  Risk Prescription drug management.    Presents to the ED  with a chief complaint of low back pain which has been ongoing for the past week and a half.  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  Reports worsening pain even though he has been taking ibuprofen  along with Tylenol.  On evaluation he does not have any red flags no fever, no IV drug use, no bowel or bladder complaints.  Palpable pain along the right buttocks down the right leg exacerbated with any type of weightbearing.  Urinalysis without any nitrites or leukocytes to suggest infection.  CBG was checked prior to discharging patient on prednisone, he denies any prior history of diabetes.  Although I do suspect that patient does need some active follow-up with the PCP for blood pressure control.  He is agreeable with plan and treatment at this time.  Low blood pressure elevated continues to deny any chest pain or shortness of breath.  We discussed keeping a log of his blood pressure.  Stable for discharge.     Portions of this note were generated with Scientist, clinical (histocompatibility and immunogenetics). Dictation errors may occur despite best attempts at proofreading.   Final diagnoses:  Acute right-sided low back pain with right-sided sciatica  Secondary hypertension    ED Discharge Orders          Ordered    cyclobenzaprine (FLEXERIL) 10 MG tablet  3 times daily        03/08/24 1019    predniSONE (DELTASONE) 20 MG tablet  Daily        03/08/24 1019    naproxen (NAPROSYN) 500 MG tablet  2 times daily        03/08/24 1019               Jaxen Samples, PA-C 03/08/24 1025    Laurice Maude BROCKS, MD 03/08/24 1415

## 2024-03-08 NOTE — Discharge Instructions (Addendum)
 You were given medications in order to help with your symptoms.  Please take 1 tablet of an anti-inflammatory such as naproxen twice a day for the next 7-day with food to help with your symptoms.  In addition you were given muscle relaxer such as Flexeril, you may take this medication up to 3 times a day.  Please be aware this medication can make you drowsy.  The third medication is a steroid, please take 2 tablets daily for the next 5 days.
